# Patient Record
Sex: Female | Born: 1960 | Race: White | Hispanic: No | Marital: Married
Health system: Southern US, Community
[De-identification: ages and names within clinical notes are randomized; demographics above are authoritative.]

---

## 1998-03-08 ENCOUNTER — Encounter: Admission: RE | Admit: 1998-03-08 | Discharge: 1998-03-08 | Payer: Self-pay | Admitting: Family Medicine

## 1998-04-05 ENCOUNTER — Encounter: Admission: RE | Admit: 1998-04-05 | Discharge: 1998-04-05 | Payer: Self-pay | Admitting: Family Medicine

## 1998-04-12 ENCOUNTER — Encounter: Admission: RE | Admit: 1998-04-12 | Discharge: 1998-04-12 | Payer: Self-pay | Admitting: Family Medicine

## 1998-05-06 ENCOUNTER — Encounter: Admission: RE | Admit: 1998-05-06 | Discharge: 1998-05-06 | Payer: Self-pay | Admitting: Family Medicine

## 1998-06-24 ENCOUNTER — Encounter: Admission: RE | Admit: 1998-06-24 | Discharge: 1998-06-24 | Payer: Self-pay | Admitting: Family Medicine

## 1998-07-26 ENCOUNTER — Encounter: Admission: RE | Admit: 1998-07-26 | Discharge: 1998-07-26 | Payer: Self-pay | Admitting: Family Medicine

## 1998-08-30 ENCOUNTER — Encounter: Admission: RE | Admit: 1998-08-30 | Discharge: 1998-08-30 | Payer: Self-pay | Admitting: Family Medicine

## 1998-11-28 ENCOUNTER — Emergency Department (HOSPITAL_COMMUNITY): Admission: EM | Admit: 1998-11-28 | Discharge: 1998-11-28 | Payer: Self-pay | Admitting: Emergency Medicine

## 1998-11-28 ENCOUNTER — Encounter: Payer: Self-pay | Admitting: Emergency Medicine

## 1998-12-30 ENCOUNTER — Encounter: Admission: RE | Admit: 1998-12-30 | Discharge: 1998-12-30 | Payer: Self-pay | Admitting: Family Medicine

## 1999-01-13 ENCOUNTER — Encounter: Admission: RE | Admit: 1999-01-13 | Discharge: 1999-01-13 | Payer: Self-pay | Admitting: Family Medicine

## 1999-02-21 ENCOUNTER — Encounter: Admission: RE | Admit: 1999-02-21 | Discharge: 1999-02-21 | Payer: Self-pay | Admitting: Family Medicine

## 1999-03-24 ENCOUNTER — Encounter: Admission: RE | Admit: 1999-03-24 | Discharge: 1999-03-24 | Payer: Self-pay | Admitting: Family Medicine

## 1999-04-07 ENCOUNTER — Encounter: Admission: RE | Admit: 1999-04-07 | Discharge: 1999-04-07 | Payer: Self-pay | Admitting: Family Medicine

## 1999-06-23 ENCOUNTER — Encounter: Admission: RE | Admit: 1999-06-23 | Discharge: 1999-06-23 | Payer: Self-pay | Admitting: Family Medicine

## 1999-06-23 ENCOUNTER — Other Ambulatory Visit: Admission: RE | Admit: 1999-06-23 | Discharge: 1999-06-23 | Payer: Self-pay | Admitting: Family Medicine

## 1999-08-20 ENCOUNTER — Encounter: Admission: RE | Admit: 1999-08-20 | Discharge: 1999-08-20 | Payer: Self-pay | Admitting: Family Medicine

## 1999-09-28 ENCOUNTER — Emergency Department (HOSPITAL_COMMUNITY): Admission: EM | Admit: 1999-09-28 | Discharge: 1999-09-28 | Payer: Self-pay | Admitting: Emergency Medicine

## 2000-06-10 ENCOUNTER — Encounter: Admission: RE | Admit: 2000-06-10 | Discharge: 2000-06-10 | Payer: Self-pay | Admitting: Family Medicine

## 2001-05-21 ENCOUNTER — Emergency Department (HOSPITAL_COMMUNITY): Admission: EM | Admit: 2001-05-21 | Discharge: 2001-05-21 | Payer: Self-pay | Admitting: Emergency Medicine

## 2001-05-24 ENCOUNTER — Encounter: Admission: RE | Admit: 2001-05-24 | Discharge: 2001-05-24 | Payer: Self-pay | Admitting: Family Medicine

## 2001-05-26 ENCOUNTER — Encounter: Payer: Self-pay | Admitting: Sports Medicine

## 2001-05-26 ENCOUNTER — Encounter: Admission: RE | Admit: 2001-05-26 | Discharge: 2001-05-26 | Payer: Self-pay | Admitting: Sports Medicine

## 2001-05-30 ENCOUNTER — Encounter: Admission: RE | Admit: 2001-05-30 | Discharge: 2001-05-30 | Payer: Self-pay | Admitting: Family Medicine

## 2001-06-10 ENCOUNTER — Encounter: Admission: RE | Admit: 2001-06-10 | Discharge: 2001-06-10 | Payer: Self-pay | Admitting: Family Medicine

## 2001-07-19 ENCOUNTER — Emergency Department (HOSPITAL_COMMUNITY): Admission: EM | Admit: 2001-07-19 | Discharge: 2001-07-20 | Payer: Self-pay | Admitting: *Deleted

## 2001-08-06 ENCOUNTER — Emergency Department (HOSPITAL_COMMUNITY): Admission: EM | Admit: 2001-08-06 | Discharge: 2001-08-06 | Payer: Self-pay | Admitting: Emergency Medicine

## 2001-08-08 ENCOUNTER — Encounter: Admission: RE | Admit: 2001-08-08 | Discharge: 2001-08-08 | Payer: Self-pay | Admitting: Family Medicine

## 2001-10-10 ENCOUNTER — Encounter: Admission: RE | Admit: 2001-10-10 | Discharge: 2001-10-10 | Payer: Self-pay | Admitting: Family Medicine

## 2001-10-31 ENCOUNTER — Encounter: Admission: RE | Admit: 2001-10-31 | Discharge: 2001-10-31 | Payer: Self-pay | Admitting: Family Medicine

## 2001-11-25 ENCOUNTER — Other Ambulatory Visit: Admission: RE | Admit: 2001-11-25 | Discharge: 2001-11-25 | Payer: Self-pay | Admitting: Family Medicine

## 2001-12-29 ENCOUNTER — Encounter: Payer: Self-pay | Admitting: Emergency Medicine

## 2001-12-29 ENCOUNTER — Emergency Department (HOSPITAL_COMMUNITY): Admission: EM | Admit: 2001-12-29 | Discharge: 2001-12-30 | Payer: Self-pay | Admitting: Emergency Medicine

## 2002-03-24 ENCOUNTER — Encounter: Payer: Self-pay | Admitting: *Deleted

## 2002-03-24 ENCOUNTER — Emergency Department (HOSPITAL_COMMUNITY): Admission: EM | Admit: 2002-03-24 | Discharge: 2002-03-24 | Payer: Self-pay | Admitting: Emergency Medicine

## 2002-04-07 ENCOUNTER — Encounter: Admission: RE | Admit: 2002-04-07 | Discharge: 2002-04-07 | Payer: Self-pay | Admitting: Family Medicine

## 2002-06-21 ENCOUNTER — Encounter: Payer: Self-pay | Admitting: Emergency Medicine

## 2002-06-21 ENCOUNTER — Emergency Department (HOSPITAL_COMMUNITY): Admission: EM | Admit: 2002-06-21 | Discharge: 2002-06-21 | Payer: Self-pay | Admitting: Emergency Medicine

## 2002-06-28 ENCOUNTER — Encounter: Payer: Self-pay | Admitting: Emergency Medicine

## 2002-06-28 ENCOUNTER — Emergency Department (HOSPITAL_COMMUNITY): Admission: EM | Admit: 2002-06-28 | Discharge: 2002-06-28 | Payer: Self-pay | Admitting: Emergency Medicine

## 2002-11-28 ENCOUNTER — Emergency Department (HOSPITAL_COMMUNITY): Admission: EM | Admit: 2002-11-28 | Discharge: 2002-11-28 | Payer: Self-pay | Admitting: Emergency Medicine

## 2002-12-05 ENCOUNTER — Encounter: Admission: RE | Admit: 2002-12-05 | Discharge: 2002-12-05 | Payer: Self-pay | Admitting: Family Medicine

## 2002-12-07 ENCOUNTER — Emergency Department (HOSPITAL_COMMUNITY): Admission: EM | Admit: 2002-12-07 | Discharge: 2002-12-07 | Payer: Self-pay | Admitting: Emergency Medicine

## 2002-12-25 ENCOUNTER — Encounter: Admission: RE | Admit: 2002-12-25 | Discharge: 2002-12-25 | Payer: Self-pay | Admitting: Family Medicine

## 2002-12-25 ENCOUNTER — Other Ambulatory Visit: Admission: RE | Admit: 2002-12-25 | Discharge: 2002-12-25 | Payer: Self-pay | Admitting: Family Medicine

## 2003-03-20 ENCOUNTER — Encounter: Admission: RE | Admit: 2003-03-20 | Discharge: 2003-03-20 | Payer: Self-pay | Admitting: Sports Medicine

## 2003-09-10 ENCOUNTER — Encounter: Admission: RE | Admit: 2003-09-10 | Discharge: 2003-09-10 | Payer: Self-pay | Admitting: Family Medicine

## 2004-01-07 ENCOUNTER — Encounter: Admission: RE | Admit: 2004-01-07 | Discharge: 2004-01-07 | Payer: Self-pay | Admitting: Family Medicine

## 2004-03-12 ENCOUNTER — Encounter: Admission: RE | Admit: 2004-03-12 | Discharge: 2004-03-12 | Payer: Self-pay | Admitting: Family Medicine

## 2004-04-02 ENCOUNTER — Encounter: Admission: RE | Admit: 2004-04-02 | Discharge: 2004-04-02 | Payer: Self-pay | Admitting: Family Medicine

## 2004-09-03 ENCOUNTER — Ambulatory Visit: Payer: Self-pay | Admitting: Family Medicine

## 2004-11-07 ENCOUNTER — Ambulatory Visit: Payer: Self-pay | Admitting: Family Medicine

## 2004-12-08 ENCOUNTER — Ambulatory Visit: Payer: Self-pay | Admitting: Family Medicine

## 2004-12-24 ENCOUNTER — Encounter (INDEPENDENT_AMBULATORY_CARE_PROVIDER_SITE_OTHER): Payer: Self-pay | Admitting: *Deleted

## 2004-12-24 LAB — CONVERTED CEMR LAB

## 2005-01-05 ENCOUNTER — Ambulatory Visit: Payer: Self-pay | Admitting: Family Medicine

## 2005-01-05 ENCOUNTER — Encounter: Payer: Self-pay | Admitting: Family Medicine

## 2006-12-23 DIAGNOSIS — J309 Allergic rhinitis, unspecified: Secondary | ICD-10-CM | POA: Insufficient documentation

## 2006-12-23 DIAGNOSIS — E669 Obesity, unspecified: Secondary | ICD-10-CM

## 2006-12-23 DIAGNOSIS — F17200 Nicotine dependence, unspecified, uncomplicated: Secondary | ICD-10-CM

## 2006-12-23 DIAGNOSIS — G479 Sleep disorder, unspecified: Secondary | ICD-10-CM | POA: Insufficient documentation

## 2006-12-23 DIAGNOSIS — F209 Schizophrenia, unspecified: Secondary | ICD-10-CM | POA: Insufficient documentation

## 2006-12-23 DIAGNOSIS — R51 Headache: Secondary | ICD-10-CM

## 2006-12-23 DIAGNOSIS — R519 Headache, unspecified: Secondary | ICD-10-CM | POA: Insufficient documentation

## 2006-12-23 DIAGNOSIS — G5600 Carpal tunnel syndrome, unspecified upper limb: Secondary | ICD-10-CM

## 2006-12-23 DIAGNOSIS — I10 Essential (primary) hypertension: Secondary | ICD-10-CM | POA: Insufficient documentation

## 2006-12-23 DIAGNOSIS — E282 Polycystic ovarian syndrome: Secondary | ICD-10-CM | POA: Insufficient documentation

## 2006-12-23 DIAGNOSIS — K589 Irritable bowel syndrome without diarrhea: Secondary | ICD-10-CM

## 2006-12-23 DIAGNOSIS — F329 Major depressive disorder, single episode, unspecified: Secondary | ICD-10-CM

## 2006-12-24 ENCOUNTER — Encounter (INDEPENDENT_AMBULATORY_CARE_PROVIDER_SITE_OTHER): Payer: Self-pay | Admitting: *Deleted

## 2007-08-05 ENCOUNTER — Encounter: Payer: Self-pay | Admitting: Family Medicine

## 2007-08-05 ENCOUNTER — Ambulatory Visit: Payer: Self-pay | Admitting: Family Medicine

## 2007-08-05 DIAGNOSIS — M1710 Unilateral primary osteoarthritis, unspecified knee: Secondary | ICD-10-CM

## 2007-08-05 DIAGNOSIS — K219 Gastro-esophageal reflux disease without esophagitis: Secondary | ICD-10-CM

## 2007-08-05 DIAGNOSIS — N951 Menopausal and female climacteric states: Secondary | ICD-10-CM

## 2007-08-07 LAB — CONVERTED CEMR LAB
ALT: <8 units/L (ref 0–35)
AST: 9 units/L (ref 0–37)
Albumin: 3.8 g/dL (ref 3.5–5.2)
Calcium: 9.2 mg/dL (ref 8.4–10.5)
Chloride: 103 meq/L (ref 96–112)
FSH: 5.1 milliintl units/mL
Potassium: 4 meq/L (ref 3.5–5.3)
Sodium: 141 meq/L (ref 135–145)
TSH: 3.146 microintl units/mL (ref 0.350–5.50)
Total Protein: 7.6 g/dL (ref 6.0–8.3)

## 2007-08-12 ENCOUNTER — Ambulatory Visit (HOSPITAL_COMMUNITY): Admission: RE | Admit: 2007-08-12 | Discharge: 2007-08-12 | Payer: Self-pay | Admitting: Family Medicine

## 2007-08-29 ENCOUNTER — Encounter: Payer: Self-pay | Admitting: Family Medicine

## 2007-09-02 ENCOUNTER — Encounter: Payer: Self-pay | Admitting: Family Medicine

## 2007-09-19 ENCOUNTER — Ambulatory Visit: Payer: Self-pay | Admitting: Family Medicine

## 2007-09-21 ENCOUNTER — Telehealth: Payer: Self-pay | Admitting: *Deleted

## 2007-09-28 ENCOUNTER — Ambulatory Visit (HOSPITAL_COMMUNITY): Admission: RE | Admit: 2007-09-28 | Discharge: 2007-09-28 | Payer: Self-pay | Admitting: Family Medicine

## 2007-10-24 ENCOUNTER — Encounter: Payer: Self-pay | Admitting: *Deleted

## 2008-01-27 ENCOUNTER — Ambulatory Visit: Payer: Self-pay | Admitting: Family Medicine

## 2008-03-09 ENCOUNTER — Ambulatory Visit: Payer: Self-pay | Admitting: Family Medicine

## 2008-03-09 ENCOUNTER — Telehealth: Payer: Self-pay | Admitting: *Deleted

## 2008-03-09 ENCOUNTER — Encounter: Admission: RE | Admit: 2008-03-09 | Discharge: 2008-03-09 | Payer: Self-pay | Admitting: Family Medicine

## 2008-03-15 ENCOUNTER — Telehealth: Payer: Self-pay | Admitting: *Deleted

## 2008-05-16 ENCOUNTER — Telehealth: Payer: Self-pay | Admitting: *Deleted

## 2008-07-17 ENCOUNTER — Telehealth: Payer: Self-pay | Admitting: Family Medicine

## 2008-09-14 ENCOUNTER — Ambulatory Visit: Payer: Self-pay | Admitting: Family Medicine

## 2008-09-14 LAB — CONVERTED CEMR LAB
ALT: 9 units/L (ref 0–35)
Albumin: 3.7 g/dL (ref 3.5–5.2)
Alkaline Phosphatase: 57 units/L (ref 39–117)
CO2: 27 meq/L (ref 19–32)
Glucose, Bld: 93 mg/dL (ref 70–99)
Platelets: 388 10*3/uL (ref 150–400)
Potassium: 4.1 meq/L (ref 3.5–5.3)
RBC: 4.22 M/uL (ref 3.87–5.11)
Sodium: 138 meq/L (ref 135–145)
Total Protein: 7.1 g/dL (ref 6.0–8.3)
WBC: 9.7 10*3/uL (ref 4.0–10.5)

## 2008-09-28 ENCOUNTER — Ambulatory Visit (HOSPITAL_COMMUNITY): Admission: RE | Admit: 2008-09-28 | Discharge: 2008-09-28 | Payer: Self-pay | Admitting: Family Medicine

## 2008-10-15 ENCOUNTER — Encounter: Admission: RE | Admit: 2008-10-15 | Discharge: 2008-10-15 | Payer: Self-pay | Admitting: Family Medicine

## 2008-11-15 ENCOUNTER — Telehealth (INDEPENDENT_AMBULATORY_CARE_PROVIDER_SITE_OTHER): Payer: Self-pay | Admitting: *Deleted

## 2009-01-01 ENCOUNTER — Telehealth: Payer: Self-pay | Admitting: Family Medicine

## 2009-01-11 ENCOUNTER — Ambulatory Visit: Payer: Self-pay | Admitting: Family Medicine

## 2009-01-11 DIAGNOSIS — IMO0002 Reserved for concepts with insufficient information to code with codable children: Secondary | ICD-10-CM | POA: Insufficient documentation

## 2009-01-11 DIAGNOSIS — M719 Bursopathy, unspecified: Secondary | ICD-10-CM

## 2009-01-11 DIAGNOSIS — M67919 Unspecified disorder of synovium and tendon, unspecified shoulder: Secondary | ICD-10-CM | POA: Insufficient documentation

## 2009-01-24 ENCOUNTER — Telehealth: Payer: Self-pay | Admitting: *Deleted

## 2009-02-08 ENCOUNTER — Telehealth (INDEPENDENT_AMBULATORY_CARE_PROVIDER_SITE_OTHER): Payer: Self-pay | Admitting: *Deleted

## 2009-02-14 ENCOUNTER — Telehealth: Payer: Self-pay | Admitting: Family Medicine

## 2009-02-15 ENCOUNTER — Ambulatory Visit: Payer: Self-pay | Admitting: Family Medicine

## 2009-02-18 ENCOUNTER — Telehealth: Payer: Self-pay | Admitting: Family Medicine

## 2009-02-19 ENCOUNTER — Telehealth: Payer: Self-pay | Admitting: *Deleted

## 2009-02-19 ENCOUNTER — Encounter: Admission: RE | Admit: 2009-02-19 | Discharge: 2009-02-19 | Payer: Self-pay | Admitting: Family Medicine

## 2009-02-19 ENCOUNTER — Ambulatory Visit: Payer: Self-pay | Admitting: Family Medicine

## 2009-03-13 ENCOUNTER — Ambulatory Visit: Payer: Self-pay | Admitting: Family Medicine

## 2009-03-28 ENCOUNTER — Ambulatory Visit: Payer: Self-pay | Admitting: Family Medicine

## 2009-03-28 DIAGNOSIS — B351 Tinea unguium: Secondary | ICD-10-CM | POA: Insufficient documentation

## 2009-04-04 ENCOUNTER — Telehealth: Payer: Self-pay | Admitting: Family Medicine

## 2009-04-05 ENCOUNTER — Encounter: Payer: Self-pay | Admitting: Family Medicine

## 2009-04-05 ENCOUNTER — Ambulatory Visit: Payer: Self-pay | Admitting: Family Medicine

## 2009-07-17 ENCOUNTER — Telehealth: Payer: Self-pay | Admitting: Family Medicine

## 2009-08-08 ENCOUNTER — Encounter (INDEPENDENT_AMBULATORY_CARE_PROVIDER_SITE_OTHER): Payer: Self-pay

## 2009-09-02 IMAGING — CR DG KNEE COMPLETE 4+V*R*
4 series · 4 of 4 positions shown · non-contrast
Comparison: none

CLINICAL DATA: Fell several weeks ago with medial knee pain.
 RIGHT KNEE - 4 VIEW:

[t knee ap right]
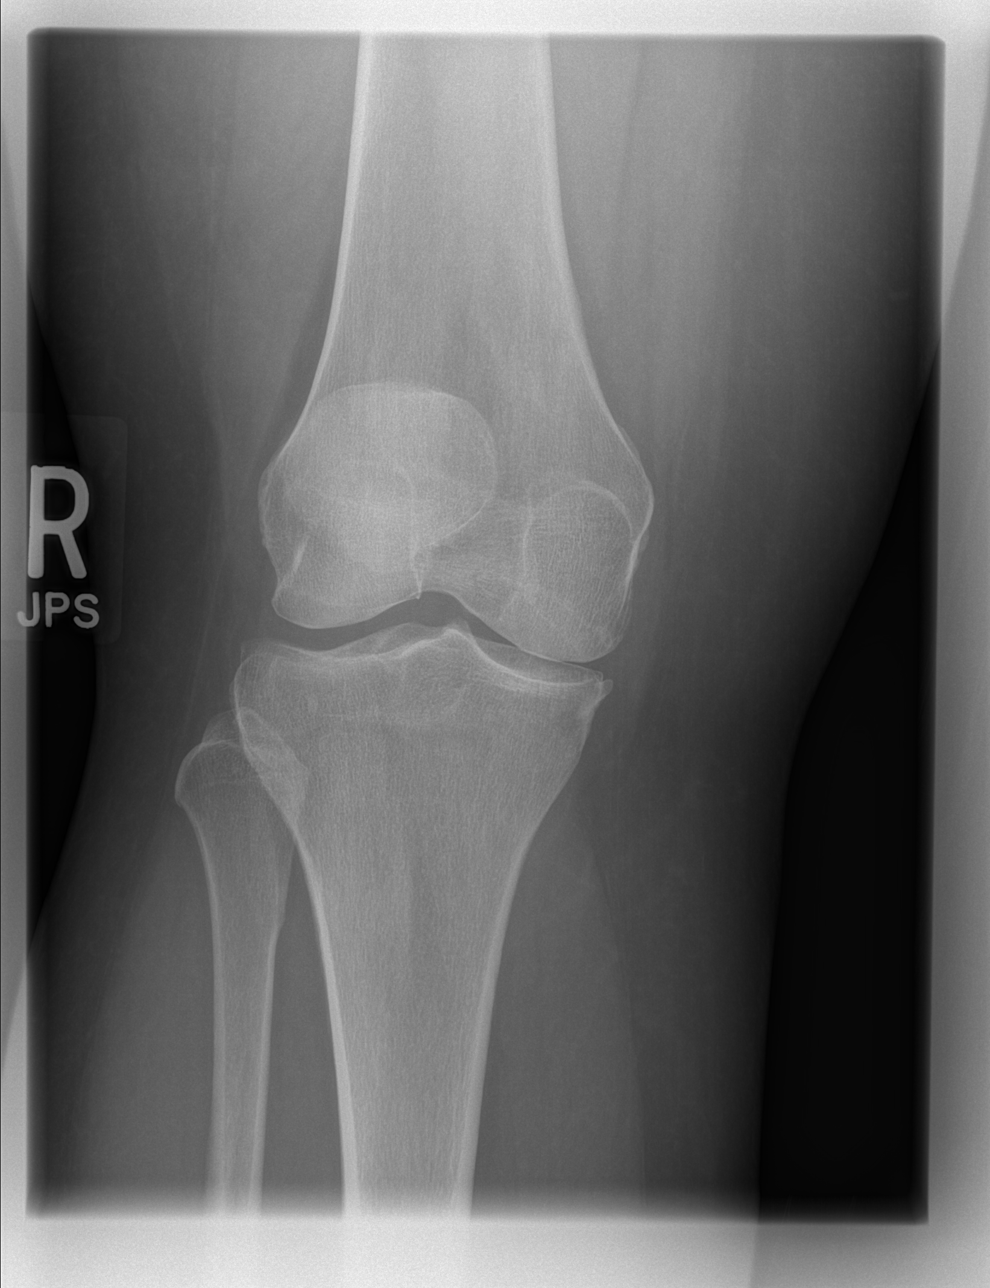

[t knee oblique right (1 of 2)]
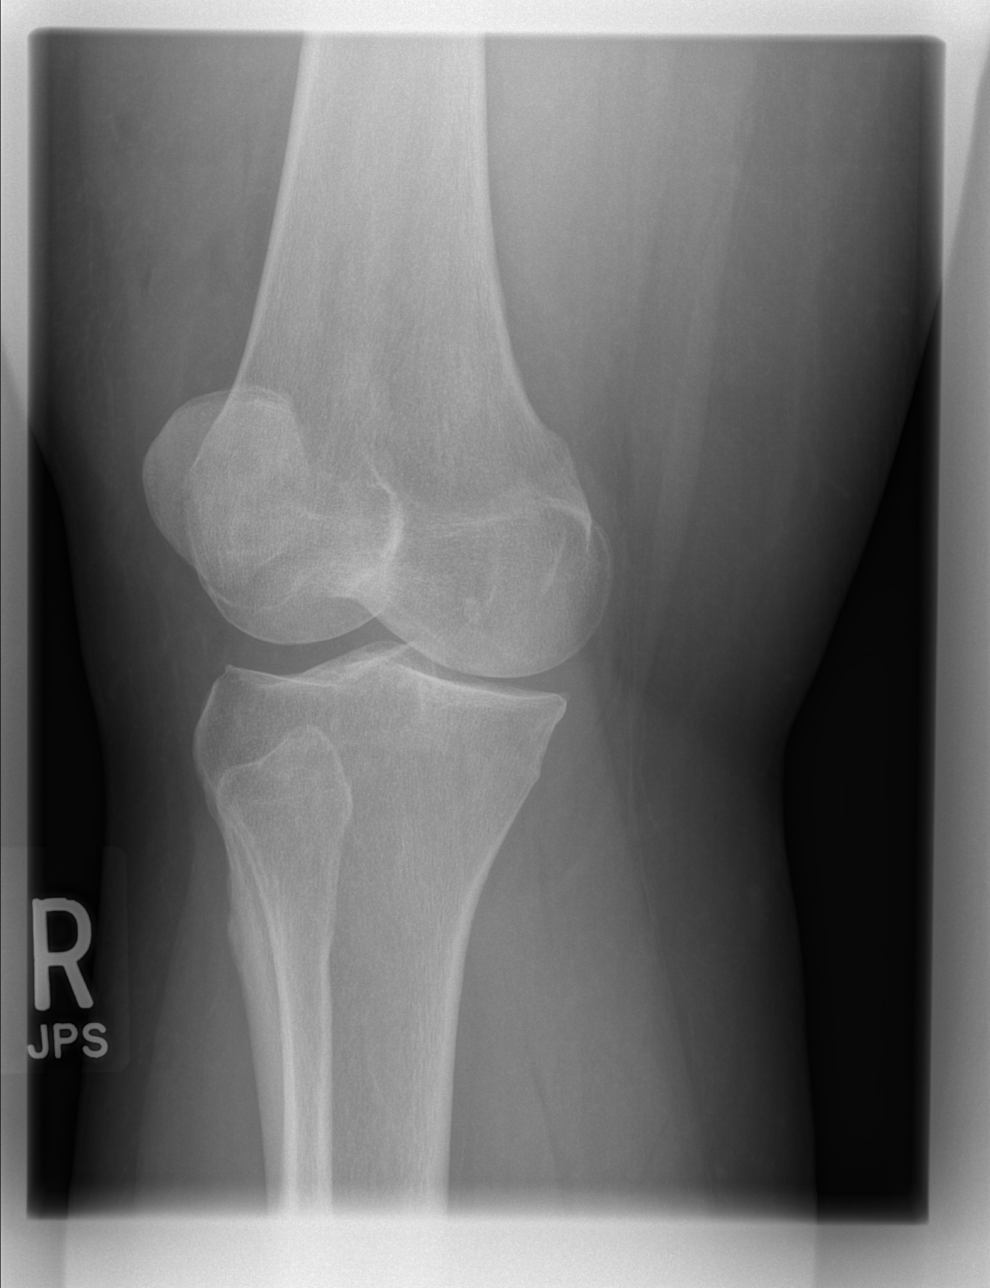

[t knee oblique right (2 of 2)]
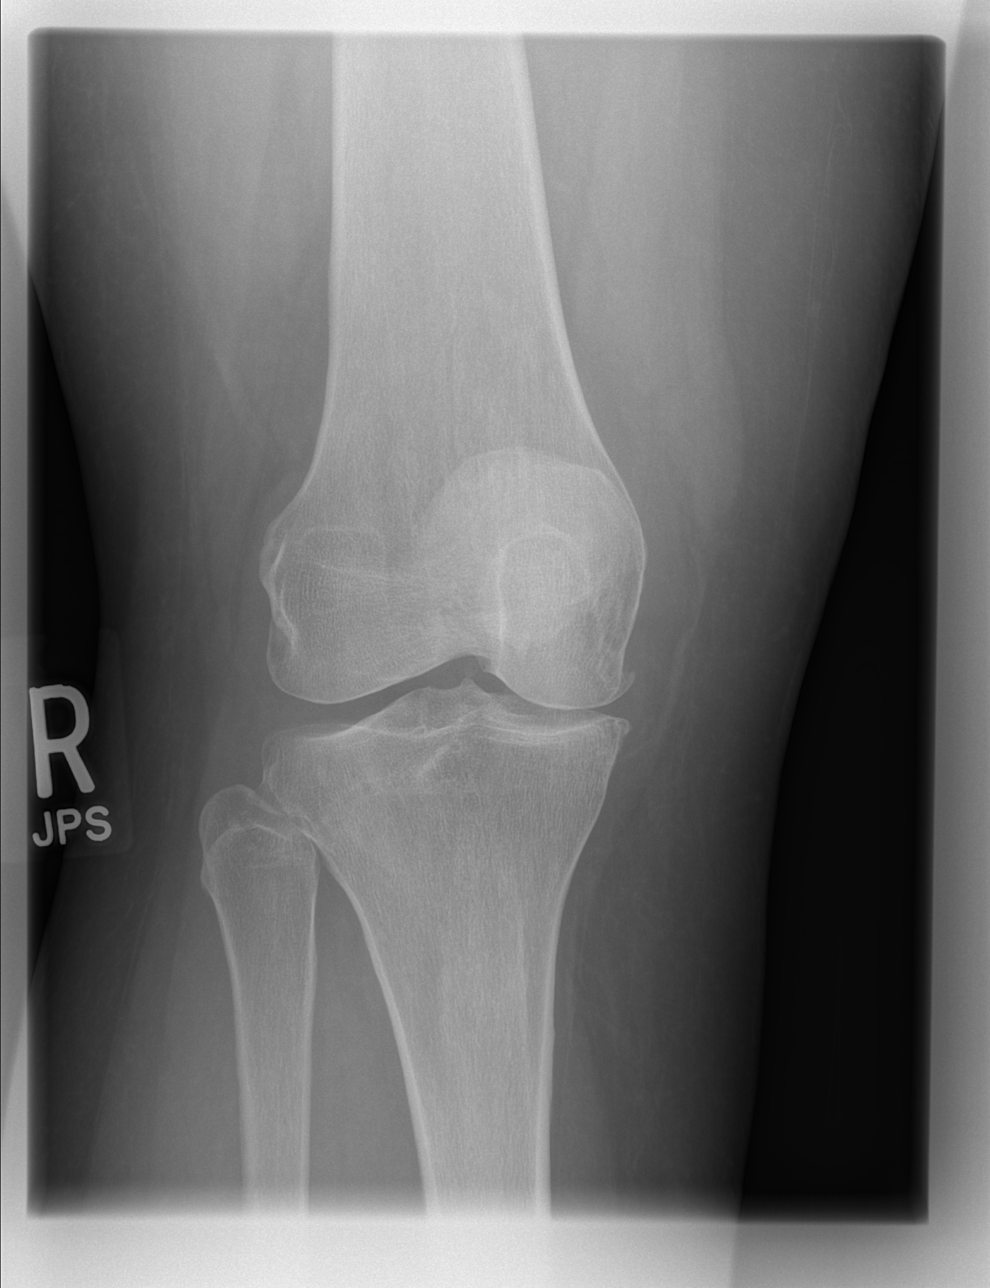

[t knee lat right]
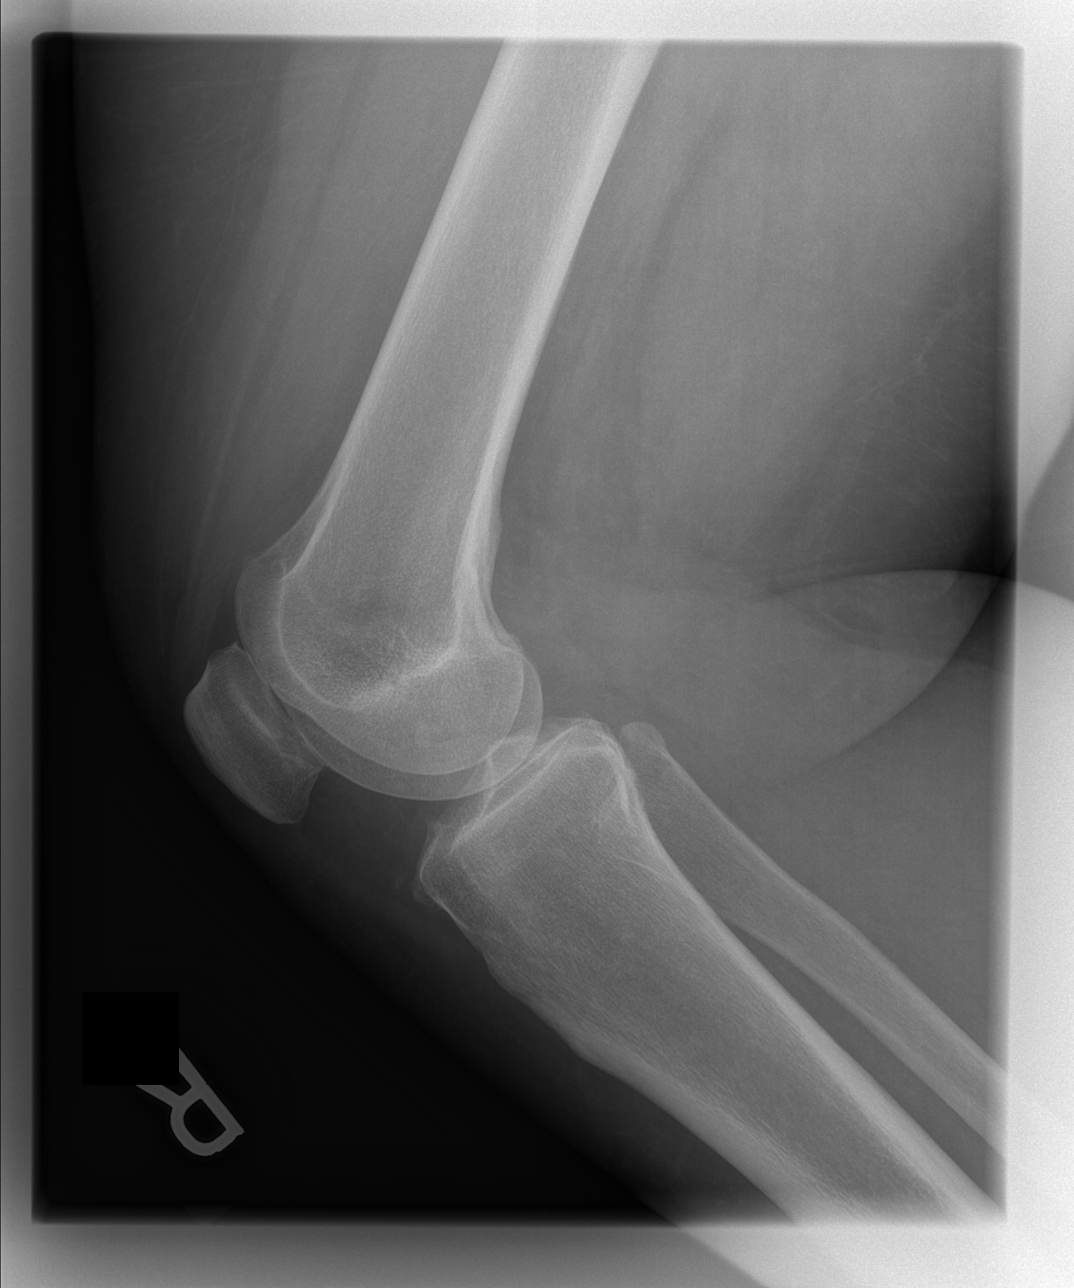

[4 of 4 positions shown; findings below may reference images not displayed]

FINDINGS: No evidence of fracture or dislocation of the left knee.  There is medial joint space narrowing with associated osteophytosis.  Mild spurring of the patella.
IMPRESSION: 1.  No evidence of acute fracture or dislocation. 
 2.  Medial compartment degenerative joint disease.

## 2009-10-01 ENCOUNTER — Encounter: Payer: Self-pay | Admitting: Family Medicine

## 2010-11-16 ENCOUNTER — Encounter: Payer: Self-pay | Admitting: Family Medicine

## 2018-10-25 ENCOUNTER — Emergency Department: Admission: EM | Admit: 2018-10-25 | Discharge: 2018-10-25 | Discharge status: Absent without leave | Payer: Self-pay

## 2018-10-25 NOTE — ED Triage Notes (Signed)
FIRST NURSE NOTE-losing weight, no appetite.With [facility].  Denies depression or anxiety.  Ambulatory.  NAD

## 2023-06-27 DEATH — deceased
